# Patient Record
Sex: Male | Born: 1970 | Race: White | Hispanic: No | Marital: Married | Smoking: Never smoker
Health system: Southern US, Community
[De-identification: ages and names within clinical notes are randomized; demographics above are authoritative.]

## PROBLEM LIST (undated history)

## (undated) DIAGNOSIS — F419 Anxiety disorder, unspecified: Secondary | ICD-10-CM

## (undated) DIAGNOSIS — N200 Calculus of kidney: Secondary | ICD-10-CM

## (undated) HISTORY — PX: VASECTOMY: SHX75

---

## 2001-05-12 ENCOUNTER — Emergency Department (HOSPITAL_COMMUNITY): Admission: EM | Admit: 2001-05-12 | Discharge: 2001-05-12 | Payer: Self-pay | Admitting: Emergency Medicine

## 2001-12-26 ENCOUNTER — Encounter: Payer: Self-pay | Admitting: Emergency Medicine

## 2001-12-26 ENCOUNTER — Emergency Department (HOSPITAL_COMMUNITY): Admission: EM | Admit: 2001-12-26 | Discharge: 2001-12-26 | Payer: Self-pay | Admitting: Emergency Medicine

## 2005-09-06 ENCOUNTER — Emergency Department (HOSPITAL_COMMUNITY): Admission: EM | Admit: 2005-09-06 | Discharge: 2005-09-06 | Payer: Self-pay | Admitting: Family Medicine

## 2006-02-07 ENCOUNTER — Emergency Department (HOSPITAL_COMMUNITY): Admission: EM | Admit: 2006-02-07 | Discharge: 2006-02-07 | Payer: Self-pay | Admitting: Family Medicine

## 2008-02-22 ENCOUNTER — Encounter: Admission: RE | Admit: 2008-02-22 | Discharge: 2008-02-22 | Payer: Self-pay | Admitting: Internal Medicine

## 2009-02-25 ENCOUNTER — Emergency Department (HOSPITAL_COMMUNITY): Admission: EM | Admit: 2009-02-25 | Discharge: 2009-02-25 | Payer: Self-pay | Admitting: Family Medicine

## 2009-05-02 ENCOUNTER — Encounter: Admission: RE | Admit: 2009-05-02 | Discharge: 2009-05-02 | Payer: Self-pay | Admitting: Occupational Medicine

## 2010-12-23 ENCOUNTER — Encounter: Payer: Self-pay | Admitting: Internal Medicine

## 2011-03-14 LAB — URINE CULTURE
Colony Count: NO GROWTH
Culture: NO GROWTH

## 2011-03-14 LAB — POCT URINALYSIS DIP (DEVICE)
Bilirubin Urine: NEGATIVE
Glucose, UA: 100 mg/dL — AB
Hgb urine dipstick: NEGATIVE
Ketones, ur: NEGATIVE mg/dL
Nitrite: POSITIVE — AB
Protein, ur: NEGATIVE mg/dL
Specific Gravity, Urine: 1.015 (ref 1.005–1.030)
Urobilinogen, UA: 1 mg/dL (ref 0.0–1.0)
pH: 6.5 (ref 5.0–8.0)

## 2012-06-21 ENCOUNTER — Emergency Department (HOSPITAL_BASED_OUTPATIENT_CLINIC_OR_DEPARTMENT_OTHER)
Admission: EM | Admit: 2012-06-21 | Discharge: 2012-06-21 | Disposition: A | Discharge status: Home / Self Care | Payer: 59 | Attending: Emergency Medicine | Admitting: Emergency Medicine

## 2012-06-21 ENCOUNTER — Emergency Department (HOSPITAL_BASED_OUTPATIENT_CLINIC_OR_DEPARTMENT_OTHER): Payer: 59

## 2012-06-21 ENCOUNTER — Encounter (HOSPITAL_BASED_OUTPATIENT_CLINIC_OR_DEPARTMENT_OTHER): Payer: Self-pay | Admitting: *Deleted

## 2012-06-21 DIAGNOSIS — F411 Generalized anxiety disorder: Secondary | ICD-10-CM | POA: Insufficient documentation

## 2012-06-21 DIAGNOSIS — Z87442 Personal history of urinary calculi: Secondary | ICD-10-CM | POA: Insufficient documentation

## 2012-06-21 DIAGNOSIS — R109 Unspecified abdominal pain: Secondary | ICD-10-CM | POA: Insufficient documentation

## 2012-06-21 DIAGNOSIS — Z79899 Other long term (current) drug therapy: Secondary | ICD-10-CM | POA: Insufficient documentation

## 2012-06-21 DIAGNOSIS — N200 Calculus of kidney: Secondary | ICD-10-CM | POA: Insufficient documentation

## 2012-06-21 HISTORY — DX: Anxiety disorder, unspecified: F41.9

## 2012-06-21 HISTORY — DX: Calculus of kidney: N20.0

## 2012-06-21 LAB — CBC WITH DIFFERENTIAL/PLATELET
Basophils Absolute: 0.1 10*3/uL (ref 0.0–0.1)
Basophils Relative: 1 % (ref 0–1)
Eosinophils Absolute: 0.2 10*3/uL (ref 0.0–0.7)
Eosinophils Relative: 2 % (ref 0–5)
Lymphocytes Relative: 46 % (ref 12–46)
MCH: 31.3 pg (ref 26.0–34.0)
MCHC: 35 g/dL (ref 30.0–36.0)
MCV: 89.4 fL (ref 78.0–100.0)
Platelets: 257 10*3/uL (ref 150–400)
RDW: 12.4 % (ref 11.5–15.5)
WBC: 8.5 10*3/uL (ref 4.0–10.5)

## 2012-06-21 LAB — URINALYSIS, ROUTINE W REFLEX MICROSCOPIC
Hgb urine dipstick: NEGATIVE
Protein, ur: NEGATIVE mg/dL
Urobilinogen, UA: 1 mg/dL (ref 0.0–1.0)

## 2012-06-21 LAB — BASIC METABOLIC PANEL
Calcium: 9.5 mg/dL (ref 8.4–10.5)
GFR calc Af Amer: >90 mL/min (ref >90)
GFR calc non Af Amer: >90 mL/min (ref >90)
Sodium: 139 mEq/L (ref 135–145)

## 2012-06-21 MED ORDER — TAMSULOSIN HCL 0.4 MG PO CAPS: 0.4000 mg | ORAL_CAPSULE | Freq: Every day | ORAL | Status: DC

## 2012-06-21 MED ORDER — HYDROMORPHONE HCL PF 1 MG/ML IJ SOLN
1.0000 mg | Freq: Once | INTRAMUSCULAR | Status: AC
  Administered 2012-06-21: 1 mg via INTRAVENOUS
  Filled 2012-06-21: qty 1

## 2012-06-21 MED ORDER — OXYCODONE-ACETAMINOPHEN 5-325 MG PO TABS: 1.0000 | ORAL_TABLET | Freq: Four times a day (QID) | ORAL | Status: AC | PRN

## 2012-06-21 MED ORDER — KETOROLAC TROMETHAMINE 30 MG/ML IJ SOLN
30.0000 mg | Freq: Once | INTRAMUSCULAR | Status: AC
Start: 2012-06-21 — End: 2012-06-21
  Administered 2012-06-21: 30 mg via INTRAVENOUS
  Filled 2012-06-21: qty 1

## 2012-06-21 MED ORDER — ONDANSETRON HCL 4 MG/2ML IJ SOLN
4.0000 mg | Freq: Once | INTRAMUSCULAR | Status: AC
  Administered 2012-06-21: 4 mg via INTRAVENOUS
  Filled 2012-06-21: qty 2

## 2012-06-21 NOTE — ED Notes (Addendum)
C/o left flank pain that started at 0500. Hx of kidney stones in past. Pain 10/10 on arrival and pt. Unable to sit still. C/o nausea denies vomiting. resp even and unlabored.

## 2012-06-21 NOTE — ED Provider Notes (Addendum)
History     CSN: [id number]  Arrival date & time 06/21/12  4098   First MD Initiated Contact with Patient 06/21/12 [phone]      Chief Complaint  Patient presents with  . Flank Pain    (Consider location/radiation/quality/duration/timing/severity/associated sxs/prior treatment) HPI Comments: Pt states for the last few days he has had some intermittent lower back pain but this am became severe.  10/10 pain that is sharp in nature.  Patient is a 41 y.o. male presenting with flank pain. The history is provided by the patient.  Flank Pain This is a new problem. The current episode started 3 to 5 hours ago. The problem occurs constantly. The problem has been gradually worsening. Associated symptoms include abdominal pain. Associated symptoms comments: Radiating from left flank to the left groin. Exacerbated by: urinating. Nothing relieves the symptoms. He has tried nothing for the symptoms. The treatment provided no relief.    Past Medical History  Diagnosis Date  . Kidney stone   . Anxiety     Past Surgical History  Procedure Date  . Vasectomy     History reviewed. No pertinent family history.  History  Substance Use Topics  . Smoking status: Never Smoker   . Smokeless tobacco: Not on file  . Alcohol Use: No      Review of Systems  Constitutional: Negative for fever.  Gastrointestinal: Positive for nausea and abdominal pain. Negative for vomiting.  Genitourinary: Positive for dysuria and flank pain.  All other systems reviewed and are negative.    Allergies  Review of patient's allergies indicates no known allergies.  Home Medications   Current Outpatient Rx  Name Route Sig Dispense Refill  . ALPRAZOLAM 0.25 MG PO TABS Oral Take 1 mg by mouth as needed.    Marland Kitchen ESCITALOPRAM OXALATE 20 MG PO TABS Oral Take 20 mg by mouth daily.    Marland Kitchen RABEPRAZOLE SODIUM 20 MG PO TBEC Oral Take 20 mg by mouth daily.    Marland Kitchen RANITIDINE HCL 150 MG PO TABS Oral Take 150 mg by mouth 2 (two)  times daily.      BP 132/87  Pulse 88  Temp 97.8 F (36.6 C) (Oral)  Resp 21  SpO2 100%  Physical Exam  Nursing note and vitals reviewed. Constitutional: He is oriented to person, place, and time. He appears well-developed and well-nourished. He appears distressed.  HENT:  Head: Normocephalic and atraumatic.  Mouth/Throat: Oropharynx is clear and moist.  Eyes: Conjunctivae and EOM are normal. Pupils are equal, round, and reactive to light.  Neck: Normal range of motion. Neck supple.  Cardiovascular: Normal rate, regular rhythm and intact distal pulses.   No murmur heard. Pulmonary/Chest: Effort normal and breath sounds normal. No respiratory distress. He has no wheezes. He has no rales.  Abdominal: Soft. Normal appearance. He exhibits no distension. There is tenderness in the right lower quadrant. There is CVA tenderness. There is no rebound and no guarding.  Musculoskeletal: Normal range of motion. He exhibits no edema and no tenderness.  Neurological: He is alert and oriented to person, place, and time.  Skin: Skin is warm. No rash noted. He is diaphoretic. No erythema.  Psychiatric: He has a normal mood and affect. His behavior is normal.    ED Course  Procedures (including critical care time)  Labs Reviewed  CBC WITH DIFFERENTIAL - Abnormal; Notable for the following:    Neutrophils Relative 40 (*)     All other components within normal limits  BASIC  METABOLIC PANEL - Abnormal; Notable for the following:    Glucose, Bld 137 (*)     BUN 25 (*)     All other components within normal limits  URINALYSIS, ROUTINE W REFLEX MICROSCOPIC   Ct Abdomen Pelvis Wo Contrast  06/21/2012  *RADIOLOGY REPORT*  Clinical Data: Left flank pain, history of kidney stones  CT ABDOMEN AND PELVIS WITHOUT CONTRAST  Technique:  Multidetector CT imaging of the abdomen and pelvis was performed following the standard protocol without intravenous contrast.  Comparison: None.  Findings: Lung bases are  essentially clear.  Possible atherosclerotic calcification involving the right coronary artery (series 2/image 4).  Unenhanced liver, spleen, pancreas, and adrenal glands are within normal limits.  Gallbladder is unremarkable.  No intrahepatic or extrahepatic ductal dilatation.  Right kidney is within normal limits.  Punctate nonobstructing left upper pole calculus (series 5/image 62).  Mild left hydronephrosis with surrounding perinephric/periureteral stranding.  No evidence of bowel obstruction.  Normal appendix.  No evidence of abdominal aortic aneurysm.  No abdominopelvic ascites.  No suspicious abdominopelvic lymphadenopathy.  Prostate is unremarkable.  3 mm calculus in the distal left ureter at the UVJ (series 2/image 90).  Bladder is underdistended.  Visualized osseous structures are within normal limits.  IMPRESSION: 3 mm calculus in the distal left ureter at the UVJ.  Mild left hydronephrosis with surrounding perinephric/periureteral stranding.  Additional punctate nonobstructing left upper pole calculus.  Original Report Authenticated By: Charline Bills, M.D.     1. Kidney stone       MDM   Pt with symptoms consistent with kidney stone.  Denies infectious sx, or GI symptoms.  Low concern for diverticulitis and no risk factors or history suggestive of AAA.  No hx suggestive of GU source (discharge) and otherwise pt is healthy. Prior hx of stone over 11 years ago which passed on it's own.  Will hydrate, treat pain and ensure no infection with UA, CBC, CMP and will get stone study to further eval.  8:19 AM Labs without signs of UTI and CT with 3mm stone at the UVJ.  8:52 AM Pt tolerating po's and pain is improving.  Will d/c home with pain control and flomax.     Gwyneth Sprout, MD 06/21/12 1610  Gwyneth Sprout, MD 06/21/12 [phone]

## 2012-06-21 NOTE — ED Notes (Signed)
No relief with 1mg  of dilaudid. Dr. Anitra Lauth aware and orders received. Medicated per order. VSS. resp even and unlabored.

## 2012-12-02 IMAGING — CT CT ABD-PELV W/O CM
2 of 4 series · 16 of 46 positions shown, 18 images · non-contrast
Comparison: None.

CLINICAL DATA: Left flank pain, history of kidney stones

CT ABDOMEN AND PELVIS WITHOUT CONTRAST
TECHNIQUE: Multidetector CT imaging of the abdomen and pelvis was
performed following the standard protocol without intravenous
contrast.

[Series 2: renal stone < 200 lbs 5.0 b31f · axial · 0.71mm/px · z∈[-539,-99]mm · 13 of 98 slices shown, 15 images]
[im 5/98  soft-tissue]
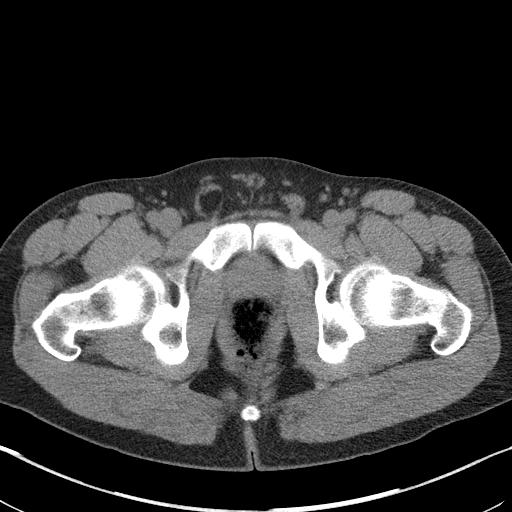
[im 5/98  bone]
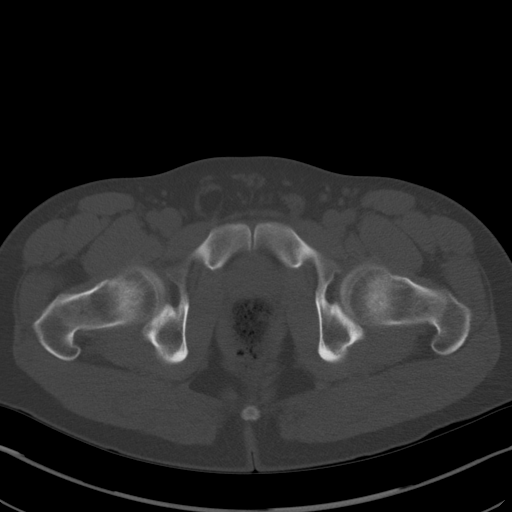
[im 14/98  soft-tissue]
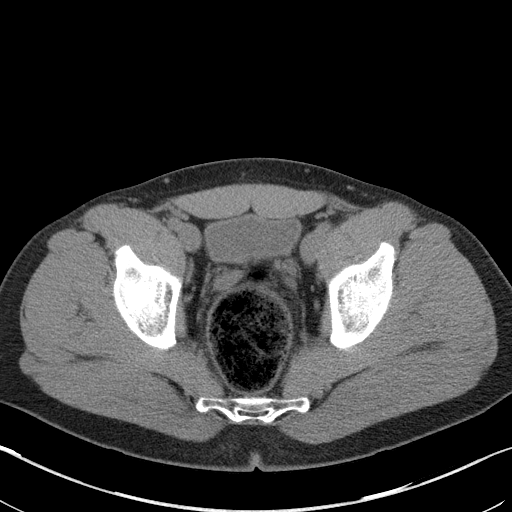
[im 19/98  soft-tissue]
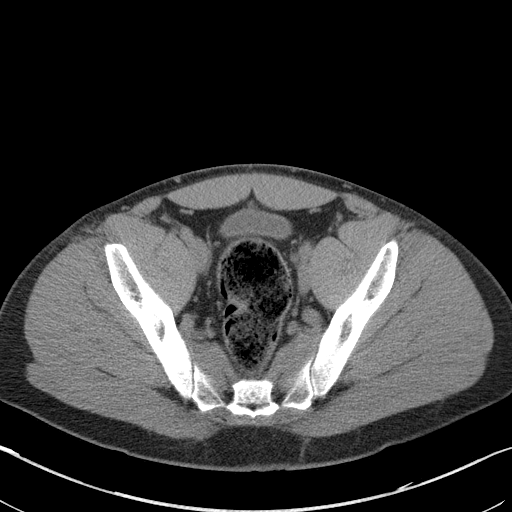
[im 28/98  soft-tissue]
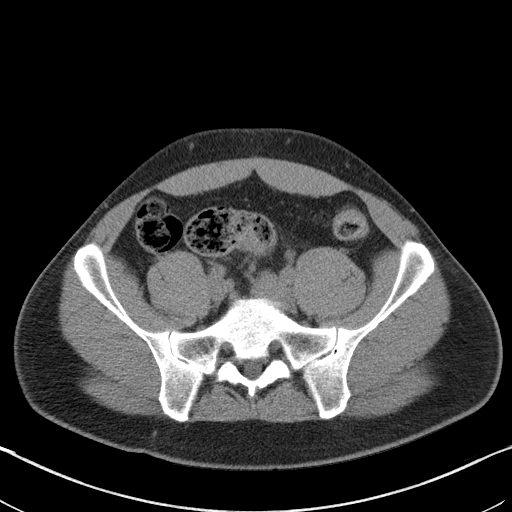
[im 33/98  soft-tissue]
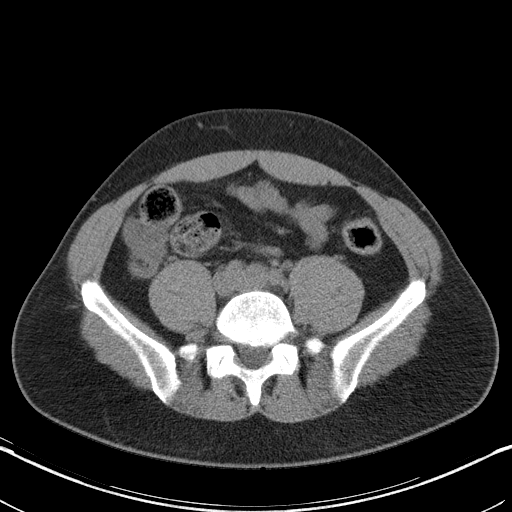
[im 42/98  soft-tissue]
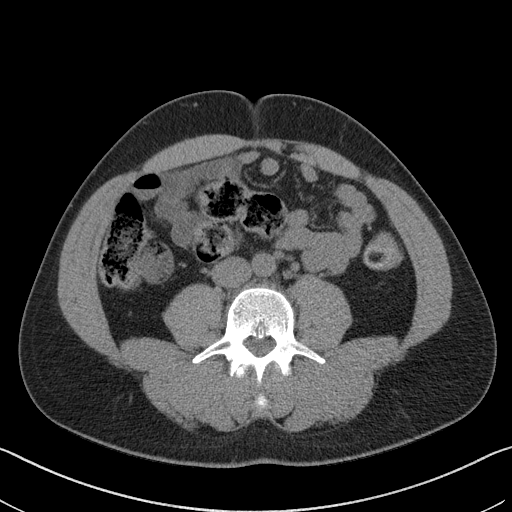
[im 51/98  soft-tissue]
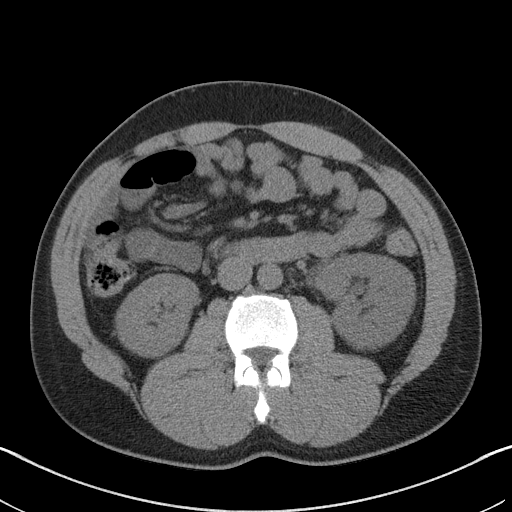
[im 56/98  soft-tissue]
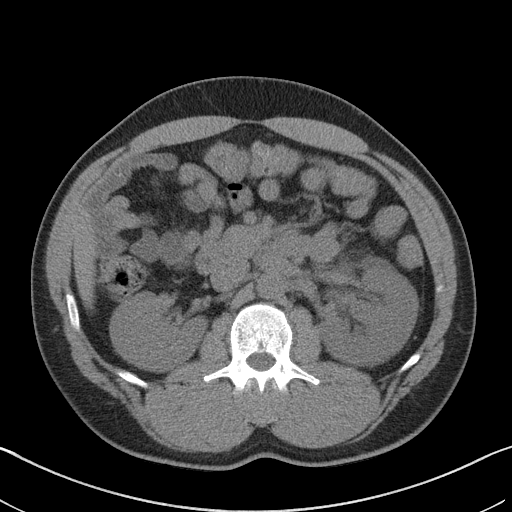
[im 65/98  soft-tissue]
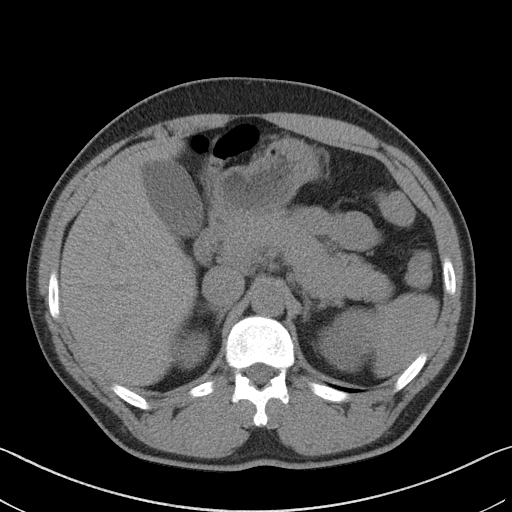
[im 65/98  bone]
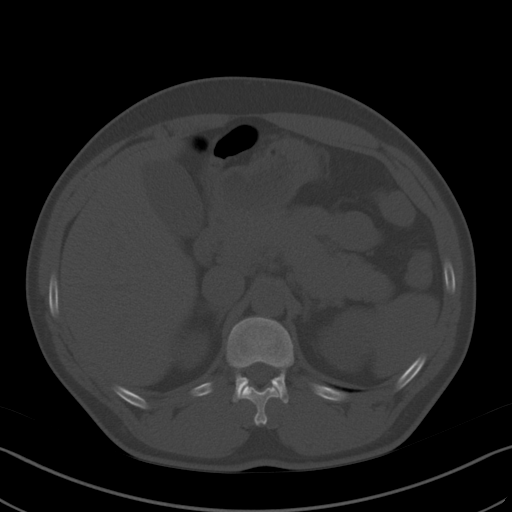
[im 70/98  soft-tissue]
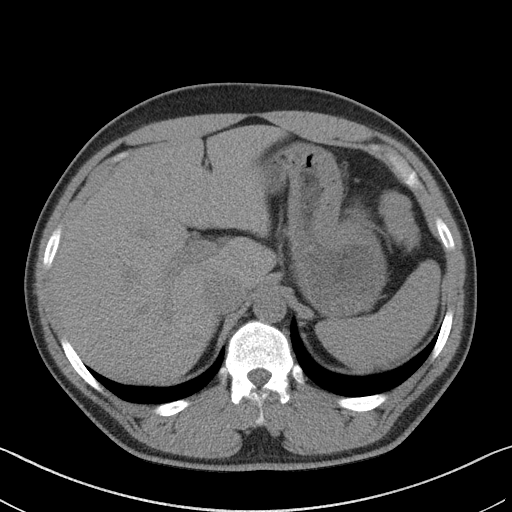
[im 79/98  soft-tissue]
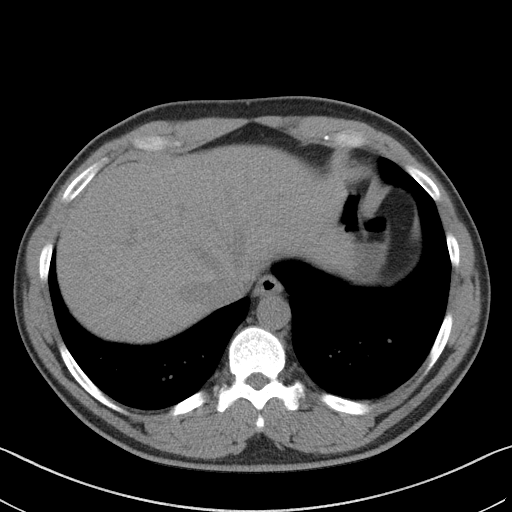
[im 84/98  soft-tissue]
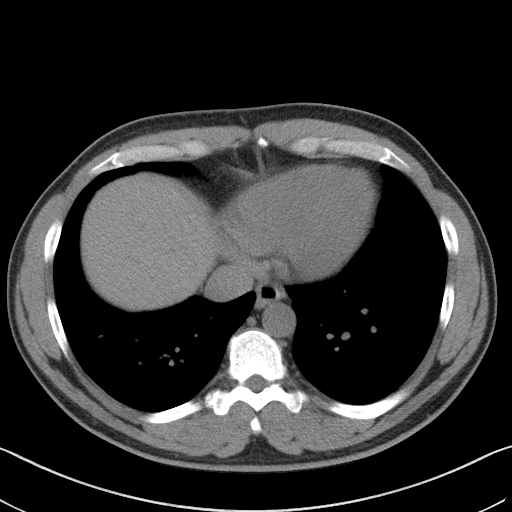
[im 93/98  soft-tissue]
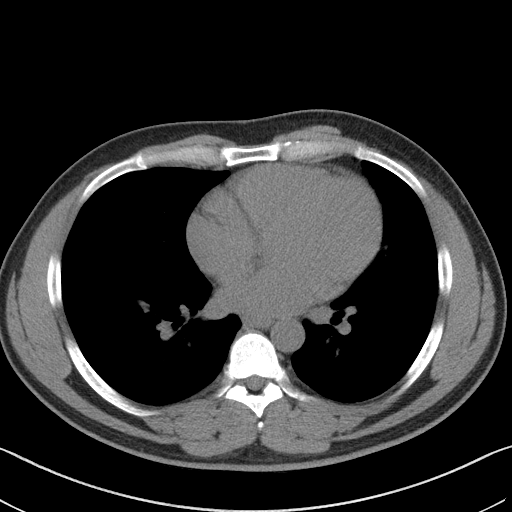

[Series 5: renal stone 3.0 coronal · coronal · 0.70mm/px · 3 of 92 slices shown]
[im 31/92  soft-tissue]
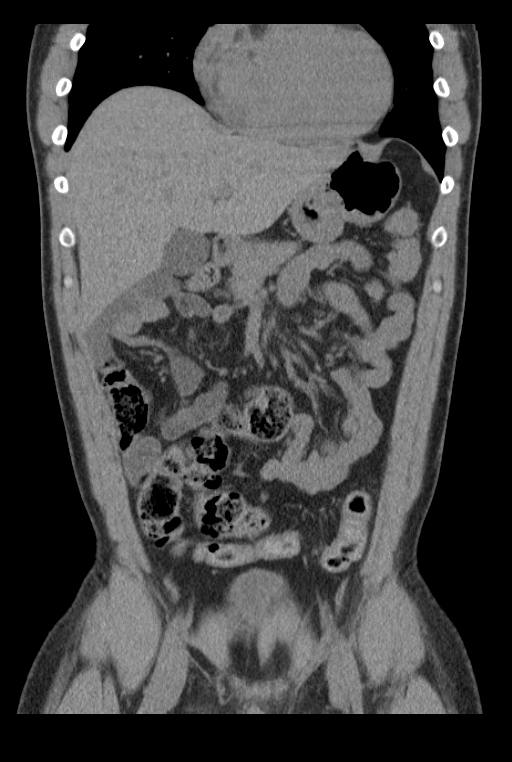
[im 41/92  soft-tissue]
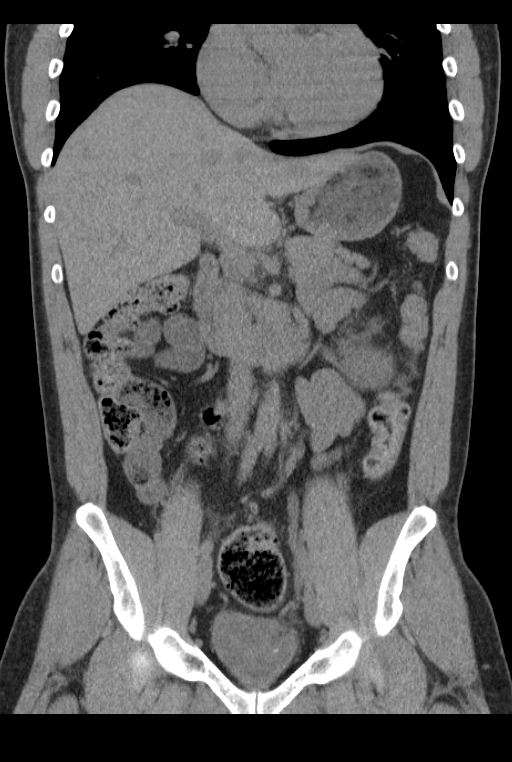
[im 51/92  soft-tissue]
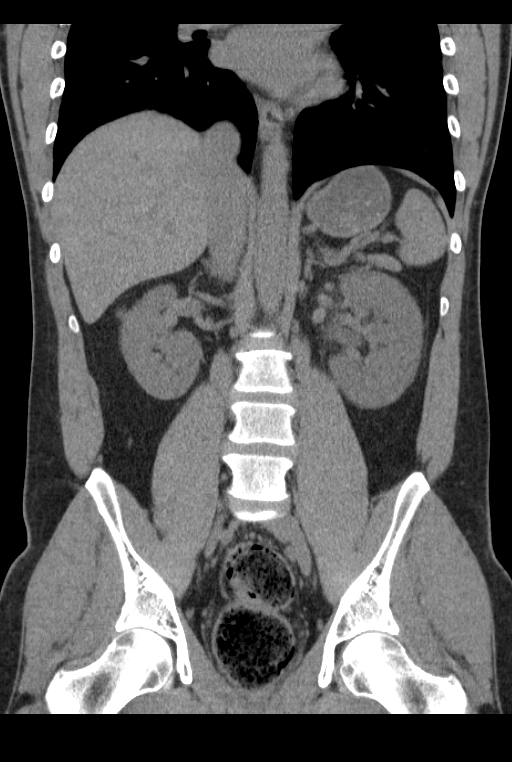

[16 of 46 positions shown; findings below may reference images not displayed]

FINDINGS: Lung bases are essentially clear.

Possible atherosclerotic calcification involving the right coronary
artery (series 2/image 4).

Unenhanced liver, spleen, pancreas, and adrenal glands are within
normal limits.

Gallbladder is unremarkable.  No intrahepatic or extrahepatic
ductal dilatation.

Right kidney is within normal limits.  Punctate nonobstructing left
upper pole calculus (series 5/image 62).  Mild left hydronephrosis
with surrounding perinephric/periureteral stranding.

No evidence of bowel obstruction.  Normal appendix.

No evidence of abdominal aortic aneurysm.

No abdominopelvic ascites.

No suspicious abdominopelvic lymphadenopathy.

Prostate is unremarkable.

3 mm calculus in the distal left ureter at the UVJ (series 2/image
90).  Bladder is underdistended.

Visualized osseous structures are within normal limits.
IMPRESSION: 3 mm calculus in the distal left ureter at the UVJ.  Mild left
hydronephrosis with surrounding perinephric/periureteral stranding.

Additional punctate nonobstructing left upper pole calculus.

## 2014-09-08 ENCOUNTER — Ambulatory Visit: Payer: 59 | Admitting: Endocrinology

## 2014-09-08 ENCOUNTER — Ambulatory Visit: Payer: 59 | Admitting: Internal Medicine

## 2014-09-08 ENCOUNTER — Ambulatory Visit (INDEPENDENT_AMBULATORY_CARE_PROVIDER_SITE_OTHER): Payer: 59 | Admitting: Endocrinology

## 2014-09-08 ENCOUNTER — Encounter: Payer: Self-pay | Admitting: Endocrinology

## 2014-09-08 VITALS — BP 132/71 | HR 68 | Temp 98.2°F | Resp 16 | Ht 71.0 in | Wt 216.4 lb

## 2014-09-08 DIAGNOSIS — R7989 Other specified abnormal findings of blood chemistry: Secondary | ICD-10-CM

## 2014-09-08 DIAGNOSIS — G4710 Hypersomnia, unspecified: Secondary | ICD-10-CM

## 2014-09-08 DIAGNOSIS — R5383 Other fatigue: Secondary | ICD-10-CM

## 2014-09-08 DIAGNOSIS — R400 Somnolence: Secondary | ICD-10-CM

## 2014-09-08 DIAGNOSIS — E291 Testicular hypofunction: Secondary | ICD-10-CM

## 2014-09-08 NOTE — Patient Instructions (Signed)
Leave off energy drinks and Androgel  Discuss sleep study with Dr Ricki MillerPang

## 2014-09-08 NOTE — Progress Notes (Signed)
Patient ID: Brendan Summers, male   DOB: Jan 04, 1971, 43 y.o.   MRN: [id number]          Chief complaint: Tiredness  History of Present Illness:  He says for the last year or so he has been feeling more sluggish. He usually wakes up feeling fairly normal but as the day goes on he feels more sluggish. He says that he does not stay active he will tend to get sleepy and could sleep more during the day He does not feel he has decreased muscle strength or stamina although he thinks he cannot build his muscle mass as much He tries to exercise regularly and for the last year has been taking an energy supplement from the health food store which may possibly contain a hormonal component, he is not clear about this  He does not complain of any hot flashes, breast enlargement, low impact fracture, decreased libido or decreased motivation He has been on treatment for depression for the last 2 years, previously seen by psychiatrist.  He has no history of androgenic steroid use, history of testicular injury or mumps in childhood.  Prior lab results show baselinetestosterone level of 338 (normal greater than 348) with normal free testosterone of 11.5 (normal greater than 6.8). He thinks this was done fasting Prolactin level: 10.8 LH level: Not done    Testoserone supplements that hehas used include AndroGel starting with 2 pumps a day in 6/15 and increased to 4 pumps a day and 9/15, recently taking 3 pumps daily on his upper arms and back of the shoulder, usually applying this early in the morning   Total testosterone in 9/15 was 293     Medication List       This list is accurate as of: 09/08/14  9:27 AM.  Always use your most recent med list.               ALPRAZolam 0.25 MG tablet  Commonly known as:  XANAX  Take 1 mg by mouth as needed.     ANDROGEL PUMP 20.25 MG/ACT (1.62%) Gel  Generic drug:  Testosterone  daily. Use 2 pumps on each shoulder daily     escitalopram 20 MG tablet   Commonly known as:  LEXAPRO  Take 20 mg by mouth daily.     RABEprazole 20 MG tablet  Commonly known as:  ACIPHEX  Take 20 mg by mouth daily.        Allergies: No Known Allergies  Past Medical History  Diagnosis Date  . Kidney stone   . Anxiety     Past Surgical History  Procedure Laterality Date  . Vasectomy      No family history on file.  Social History:  reports that he has never smoked. He does not have any smokeless tobacco history on file. He reports that he does not drink alcohol or use illicit drugs.  ROS  He has difficulty with gradual weight gain. He thinks this is mostly in his abdomen and he feels that he has increased body fat. Apparently he had body fat measurement done and it was about 29%, he thinks it was previously about 15%  He has had difficulties with sustaining erections for the last 2-3 years  He has had a history of frequent snoring especially in the last month or so. However his wife has not noticed any changes in his breathing patterns at night  No history of unusual headaches except some sinus headaches No history of blurred  vision including peripheral vision History of hypertension No history of abnormal fasting glucose or diabetes  He has had mild hyperlipidemia with triglycerides 247 and LDL 126  No history of anemia, last hemoglobin was 15.7  General Examination:   BP 132/71  Pulse 68  Temp(Src) 98.2 F (36.8 C)  Resp 16  Ht 5\' 11"  (1.803 m)  Wt 216 lb 6.4 oz (98.158 kg)  BMI 30.19 kg/m2  SpO2 97%  GENERAL APPEARANCE:mild abdominal obesity present but no cushingoid features like central fat deposition SKIN:normal, no rash or pigmentation.  HEENT:Oral mucosa normal. Normal oropharyngeal opening.  EYES:normal external appearance of eyes, Fundi benign.   NECK:no lymphadenopathy, no thyromegaly.  CHEST: Gynecomastia: Minimal breast tissue present LUNGS:clear to auscultation bilaterally, no  wheezes, rhonchi, rales.  HEART:normal S1 And S2, no S3, S4, murmur or click.  ABDOMEN:no hepatosplenomegaly, no masses palpated, soft and not tender.   MALE GENITOURINARY:Testicles are about 3 cm bilaterally, relatively small MUSCULOSKELETALNo enlargement or deformity of joints.  EXTREMITIES:no clubbing, no edema.  NEUROLOGIC EXAM: Biceps reflexes normal (2+) bilaterally.   Assessment/ Plan:  ? HYPOGONADISM:  He has nonspecific symptoms of fatigue, sluggishness and daytime somnolence associated with erectile dysfunction but no decreased muscle strength or decreased libido. Baseline free testosterone level is quite normal and this is more accurate than total testosterone especially with his having mild abdominal obesity Also not clear if he is taking a form of testosterone and his herbal supplement from the health food store which may be interfering with testosterone testing or levels  He has other issues that are potentially the cause of his fatigue such as sleep apnea because of his history of snoring, daytime somnolence and weight gain; also may have erectile dysfunction related to taking Lexapro  Discussed that possibly the Lexapro may be causing his weight gain over the last 2 years  Recommendations: Stop all herbal supplements and testosterone supplement to facilitate reevaluation as follows:  Check morning free testosterone level along with LH levels in 6 weeks. Further management will depend on his followup levels  He does need to have sleep apnea ruled out with a sleep study from his PCP and advised him to followup for this   [name],[name] 09/08/2014, 9:27 AM

## 2014-09-12 ENCOUNTER — Ambulatory Visit: Payer: 59 | Admitting: Internal Medicine

## 2023-04-07 ENCOUNTER — Other Ambulatory Visit (HOSPITAL_BASED_OUTPATIENT_CLINIC_OR_DEPARTMENT_OTHER): Payer: Self-pay | Admitting: Endocrinology

## 2023-04-07 DIAGNOSIS — E7800 Pure hypercholesterolemia, unspecified: Secondary | ICD-10-CM

## 2023-05-22 ENCOUNTER — Ambulatory Visit (HOSPITAL_BASED_OUTPATIENT_CLINIC_OR_DEPARTMENT_OTHER)
Admission: RE | Admit: 2023-05-22 | Discharge: 2023-05-22 | Disposition: A | Discharge status: Home / Self Care | Payer: Self-pay | Source: Ambulatory Visit | Attending: Endocrinology | Admitting: Endocrinology

## 2023-05-22 DIAGNOSIS — E7800 Pure hypercholesterolemia, unspecified: Secondary | ICD-10-CM | POA: Insufficient documentation

## 2023-05-25 NOTE — Progress Notes (Unsigned)
Cardiology Office Note:    Date:  05/28/2023   ID:  Brendan Summers, DOB Nov 09, 1971, MRN [id number]  PCP:  Rick Duff, PA-C  Cardiologist:  None  Electrophysiologist:  None   Referring MD: Rick Duff, PA-C   Chief Complaint  Patient presents with   Coronary Artery Disease    History of Present Illness:    Brendan Summers is a 52 y.o. male with a hx of hypogonadism, hypothyroidism, hyperlipidemia who is referred by Betsey Holiday, PA for evaluation of elevated calcium score.  Calcium score on 05/22/2023 was 171 (94th percentile), mild ascending aortic dilatation measuring 38 mm.  Denies any chest pain, dyspnea, lightheadedness, syncope, lower extremity edema, or palpitations.  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  Very active, does about 10,000 steps per day.  He denies any exertional symptoms.  No smoking history.  Family history includes paternal grandfather died of MI in 37s.   Past Medical History:  Diagnosis Date   Anxiety    Kidney stone     Past Surgical History:  Procedure Laterality Date   VASECTOMY      Current Medications: Current Meds  Medication Sig   Arginine (L-ARGININE-500) 500 MG CAPS Take by mouth as needed. 5 grams as needed   aspirin EC 81 MG tablet Take 81 mg by mouth daily. Swallow whole.   B-D 3CC LUER-LOK SYR 18GX1-1/2 18G X 1-1/2" 3 ML MISC USE TO DRAW UP TESTOSTERONE   Cholecalciferol (D3 EXTRA STRENGTH) 125 MCG (5000 UT) CAPS    citalopram (CELEXA) 10 MG tablet Take 10 mg by mouth daily.   escitalopram (LEXAPRO) 10 MG tablet Take 10 mg by mouth daily.   Homeopathic Products (LIVER SUPPORT SL)    levocetirizine (XYZAL ALLERGY 24HR) 5 MG tablet Take 5 mg by mouth every evening.   NP THYROID 90 MG tablet Take 90 mg by mouth daily.   Omega-3 1000 MG CAPS    rosuvastatin (CRESTOR) 10 MG tablet Take 1 tablet (10 mg total) by mouth daily.   Testosterone Cypionate 200 MG/ML KIT Inject 5 mLs into the skin. weekly     Allergies:   Patient  has no known allergies.   Social History   Socioeconomic History   Marital status: Married    Spouse name: Not on file   Number of children: Not on file   Years of education: Not on file   Highest education level: Not on file  Occupational History   Not on file  Tobacco Use   Smoking status: Never   Smokeless tobacco: Not on file  Substance and Sexual Activity   Alcohol use: No   Drug use: No   Sexual activity: Not on file  Other Topics Concern   Not on file  Social History Narrative   Not on file   Social Determinants of Health   Financial Resource Strain: Not on file  Food Insecurity: Not on file  Transportation Needs: Not on file  Physical Activity: Not on file  Stress: Not on file  Social Connections: Not on file     Family History: Family history includes paternal grandfather died of MI in 56s.  ROS:   Please see the history of present illness.     All other systems reviewed and are negative.  EKGs/Labs/Other Studies Reviewed:    The following studies were reviewed today:   EKG:   05/28/2023: Normal sinus rhythm, rate 66, no ST abnormalities  Recent Labs: No results found for  requested labs within last 365 days.  Recent Lipid Panel No results found for: "CHOL", "TRIG", "HDL", "CHOLHDL", "VLDL", "LDLCALC", "LDLDIRECT"  Physical Exam:    VS:  BP 124/82   Pulse 66   Ht 5\' 10"  (1.778 m)   Wt 218 lb 6.4 oz (99.1 kg)   SpO2 96%   BMI 31.34 kg/m     Wt Readings from Last 3 Encounters:  05/28/23 218 lb 6.4 oz (99.1 kg)  09/08/14 216 lb 6.4 oz (98.2 kg)     GEN:  Well nourished, well developed in no acute distress HEENT: Normal NECK: No JVD; No carotid bruits LYMPHATICS: No lymphadenopathy CARDIAC: RRR, no murmurs, rubs, gallops RESPIRATORY:  Clear to auscultation without rales, wheezing or rhonchi  ABDOMEN: Soft, non-tender, non-distended MUSCULOSKELETAL:  No edema; No deformity  SKIN: Warm and dry NEUROLOGIC:  Alert and oriented x  3 PSYCHIATRIC:  Normal affect   ASSESSMENT:    1. Coronary artery disease involving native coronary artery of native heart without angina pectoris   2. Hyperlipidemia, unspecified hyperlipidemia type   3. Aortic dilatation (HCC)    PLAN:    CAD:  Calcium score on 05/22/2023 was 171 (94th percentile), denies any anginal symptoms -Start rosuvastatin 10 mg daily  Aortic dilatation: Mild ascending aorta dilatation measuring 38 mm on CT 05/22/2023.  Follow-up with echocardiogram in 1 year  Hyperlipidemia: LDL 144 on 02/27/23.  Start rosuvastatin 10 mg daily as above.  Check fasting lipid panel in 2 months  RTC in 6 months   Medication Adjustments/Labs and Tests Ordered: Current medicines are reviewed at length with the patient today.  Concerns regarding medicines are outlined above.  Orders Placed This Encounter  Procedures   Lipid panel   EKG 12-Lead   ECHOCARDIOGRAM COMPLETE   Meds ordered this encounter  Medications   rosuvastatin (CRESTOR) 10 MG tablet    Sig: Take 1 tablet (10 mg total) by mouth daily.    Dispense:  90 tablet    Refill:  3    Patient Instructions  Medication Instructions:  Rosuvastatin 10 mg every evening *If you need a refill on your cardiac medications before your next appointment, please call your pharmacy*   Lab Work: Lipid panel- Please return for Blood Work in 2 months. No appointment needed, lab here at the office is open Monday-Friday from 8AM to 4PM. If you have labs (blood work) drawn today and your tests are completely normal, you will receive your results only by: MyChart Message (if you have MyChart) OR A paper copy in the mail If you have any lab test that is abnormal or we need to change your treatment, we will call you to review the results.   Testing/Procedures: Your physician has requested that you have an echocardiogram in One Year. Echocardiography is a painless test that uses sound waves to create images of your heart. It provides  your doctor with information about the size and shape of your heart and how well your heart's chambers and valves are working. This procedure takes approximately one hour. There are no restrictions for this procedure. Please do NOT wear cologne, perfume, aftershave, or lotions (deodorant is allowed). Please arrive 15 minutes prior to your appointment time.    Follow-Up: At [facility], you and your health needs are our priority.  As part of our continuing mission to provide you with exceptional heart care, we have created designated Provider Care Teams.  These Care Teams include your primary Cardiologist (physician) and Advanced  Practice Providers (APPs -  Physician Assistants and Nurse Practitioners) who all work together to provide you with the care you need, when you need it.  We recommend signing up for the patient portal called "MyChart".  Sign up information is provided on this After Visit Summary.  MyChart is used to connect with patients for Virtual Visits (Telemedicine).  Patients are able to view lab/test results, encounter notes, upcoming appointments, etc.  Non-urgent messages can be sent to your provider as well.   To learn more about what you can do with MyChart, go to [email or web address].    Your next appointment:   6 month(s)  Provider:   Dr Bjorn Pippin    Signed, Little Ishikawa, MD  05/28/2023 5:43 PM    [facility]

## 2023-05-28 ENCOUNTER — Encounter: Payer: Self-pay | Admitting: Cardiology

## 2023-05-28 ENCOUNTER — Ambulatory Visit: Payer: 59 | Attending: Cardiology | Admitting: Cardiology

## 2023-05-28 VITALS — BP 124/82 | HR 66 | Ht 70.0 in | Wt 218.4 lb

## 2023-05-28 DIAGNOSIS — E785 Hyperlipidemia, unspecified: Secondary | ICD-10-CM | POA: Diagnosis not present

## 2023-05-28 DIAGNOSIS — I2510 Atherosclerotic heart disease of native coronary artery without angina pectoris: Secondary | ICD-10-CM

## 2023-05-28 DIAGNOSIS — Z136 Encounter for screening for cardiovascular disorders: Secondary | ICD-10-CM

## 2023-05-28 DIAGNOSIS — I77819 Aortic ectasia, unspecified site: Secondary | ICD-10-CM

## 2023-05-28 MED ORDER — ROSUVASTATIN CALCIUM 10 MG PO TABS: 10.0000 mg | ORAL_TABLET | Freq: Every day | ORAL | 3 refills | Status: DC

## 2023-05-28 NOTE — Patient Instructions (Signed)
Medication Instructions:  Rosuvastatin 10 mg every evening *If you need a refill on your cardiac medications before your next appointment, please call your pharmacy*   Lab Work: Lipid panel- Please return for Blood Work in 2 months. No appointment needed, lab here at the office is open Monday-Friday from 8AM to 4PM. If you have labs (blood work) drawn today and your tests are completely normal, you will receive your results only by: MyChart Message (if you have MyChart) OR A paper copy in the mail If you have any lab test that is abnormal or we need to change your treatment, we will call you to review the results.   Testing/Procedures: Your physician has requested that you have an echocardiogram in One Year. Echocardiography is a painless test that uses sound waves to create images of your heart. It provides your doctor with information about the size and shape of your heart and how well your heart's chambers and valves are working. This procedure takes approximately one hour. There are no restrictions for this procedure. Please do NOT wear cologne, perfume, aftershave, or lotions (deodorant is allowed). Please arrive 15 minutes prior to your appointment time.    Follow-Up: At [facility], you and your health needs are our priority.  As part of our continuing mission to provide you with exceptional heart care, we have created designated Provider Care Teams.  These Care Teams include your primary Cardiologist (physician) and Advanced Practice Providers (APPs -  Physician Assistants and Nurse Practitioners) who all work together to provide you with the care you need, when you need it.  We recommend signing up for the patient portal called "MyChart".  Sign up information is provided on this After Visit Summary.  MyChart is used to connect with patients for Virtual Visits (Telemedicine).  Patients are able to view lab/test results, encounter notes, upcoming appointments, etc.  Non-urgent  messages can be sent to your provider as well.   To learn more about what you can do with MyChart, go to [email or web address].    Your next appointment:   6 month(s)  Provider:   Dr Bjorn Pippin

## 2023-07-22 ENCOUNTER — Encounter: Payer: Self-pay | Admitting: Cardiology

## 2023-07-22 ENCOUNTER — Telehealth: Payer: Self-pay | Admitting: Cardiology

## 2023-07-22 DIAGNOSIS — E785 Hyperlipidemia, unspecified: Secondary | ICD-10-CM

## 2023-07-22 NOTE — Telephone Encounter (Signed)
Pt c/o medication issue:  1. Name of Medication:   rosuvastatin (CRESTOR) 10 MG tablet   2. How are you currently taking this medication (dosage and times per day)?  As prescribed  3. Are you having a reaction (difficulty breathing--STAT)?   Sore muscles in lower back/hips and leg aches/stiffness  4. What is your medication issue?   Patient stated he had lab tests done at his job and this medication brought his cholesterol down but wants to know due to the side effects if he can have a medication change. Patient stated his cholesterol results were:  Total 164  HDL 66  Trigly  80   LDL 82 collected on 8/5.  Patient stated he is not due to do lab work until around Sep 4.

## 2023-07-22 NOTE — Telephone Encounter (Signed)
Patient states side effects since 2 weeks into taking medication.  He states it is a "constant ache, stiffness, and soreness"  States he has been to chiropractor was he does with pain and this has not helped.  Labs were done with his work physical on August 5 th.  He will send through My Chart. His next labs are due for September 5.  He ask to change the medication due to side effects.

## 2023-07-23 ENCOUNTER — Other Ambulatory Visit: Payer: Self-pay

## 2023-07-23 DIAGNOSIS — E785 Hyperlipidemia, unspecified: Secondary | ICD-10-CM

## 2023-07-23 MED ORDER — ATORVASTATIN CALCIUM 20 MG PO TABS: 20.0000 mg | ORAL_TABLET | Freq: Every day | ORAL | 6 refills | Status: DC

## 2023-07-23 NOTE — Telephone Encounter (Signed)
Recommend switch rosuvastatin to atorvastatin 20 mg daily.  Check fasting lipid panel in 2 months

## 2023-07-23 NOTE — Telephone Encounter (Signed)
See telephone encounter.  Labs look good and cholesterol has improved but with having myalgias on rosuvastatin would switch to atorvastatin 20 mg daily

## 2023-07-23 NOTE — Telephone Encounter (Signed)
Spoke to patient Dr.Schumann advised to stop Rosuvastatin.When pain has gone start Atorvastatin 20 mg daily.Check fasting lipid panel in 2 months after taking Atorvastatin.Lab order mailed.

## 2023-09-15 ENCOUNTER — Other Ambulatory Visit: Payer: Self-pay

## 2023-09-15 MED ORDER — ATORVASTATIN CALCIUM 20 MG PO TABS: 20.0000 mg | ORAL_TABLET | Freq: Every day | ORAL | 2 refills | Status: DC

## 2023-09-17 ENCOUNTER — Other Ambulatory Visit (HOSPITAL_BASED_OUTPATIENT_CLINIC_OR_DEPARTMENT_OTHER): Payer: Self-pay | Admitting: *Deleted

## 2023-09-17 MED ORDER — ATORVASTATIN CALCIUM 20 MG PO TABS: 20.0000 mg | ORAL_TABLET | Freq: Every day | ORAL | 2 refills | Status: DC

## 2023-11-10 ENCOUNTER — Encounter: Payer: Self-pay | Admitting: Cardiology

## 2023-11-12 LAB — LIPID PANEL
Chol/HDL Ratio: 2.4 {ratio} (ref 0.0–5.0)
Cholesterol, Total: 163 mg/dL (ref 100–199)
HDL: 67 mg/dL (ref >39)
LDL Chol Calc (NIH): 83 mg/dL (ref 0–99)
Triglycerides: 64 mg/dL (ref 0–149)
VLDL Cholesterol Cal: 13 mg/dL (ref 5–40)

## 2023-11-13 ENCOUNTER — Telehealth: Payer: Self-pay | Admitting: *Deleted

## 2023-11-13 DIAGNOSIS — E785 Hyperlipidemia, unspecified: Secondary | ICD-10-CM

## 2023-11-13 DIAGNOSIS — I2510 Atherosclerotic heart disease of native coronary artery without angina pectoris: Secondary | ICD-10-CM

## 2023-11-13 MED ORDER — ATORVASTATIN CALCIUM 40 MG PO TABS: 40.0000 mg | ORAL_TABLET | Freq: Every day | ORAL | 3 refills | Status: DC

## 2023-11-13 NOTE — Telephone Encounter (Signed)
Called patient and result given and Atorvastatin 40mg  sent to [facility] home delivery  pharmacy request by patients. Patient made aware to have fasting labs done in 2 months per Dr. Bjorn Pippin recommendations.

## 2023-11-16 NOTE — Progress Notes (Unsigned)
Cardiology Office Note:    Date:  11/16/2023   ID:  YEHYA ZILE, DOB 1971-10-16, MRN [id number]  PCP:  Rick Duff, PA-C  Cardiologist:  None  Electrophysiologist:  None   Referring MD: Rick Duff, PA-C   No chief complaint on file.   History of Present Illness:    Brendan Summers is a 52 y.o. male with a hx of hypogonadism, hypothyroidism, hyperlipidemia who presents for follow-up.  He was referred by Betsey Holiday, PA for evaluation of elevated calcium score, initially seen 05/28/2023.  Calcium score on 05/22/2023 was 171 (94th percentile), mild ascending aortic dilatation measuring 38 mm.  Denies any chest pain, dyspnea, lightheadedness, syncope, lower extremity edema, or palpitations.  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  Very active, does about 10,000 steps per day.  He denies any exertional symptoms.  No smoking history.  Family history includes paternal grandfather died of MI in 35s.  At initial clinic visit was started on rosuvastatin 10 mg daily but developed myalgias.  Switch to atorvastatin 20 mg daily   Past Medical History:  Diagnosis Date   Anxiety    Kidney stone     Past Surgical History:  Procedure Laterality Date   VASECTOMY      Current Medications: No outpatient medications have been marked as taking for the 11/18/23 encounter (Appointment) with Little Ishikawa, MD.     Allergies:   Patient has no known allergies.   Social History   Socioeconomic History   Marital status: Married    Spouse name: Not on file   Number of children: Not on file   Years of education: Not on file   Highest education level: Not on file  Occupational History   Not on file  Tobacco Use   Smoking status: Never   Smokeless tobacco: Not on file  Substance and Sexual Activity   Alcohol use: No   Drug use: No   Sexual activity: Not on file  Other Topics Concern   Not on file  Social History Narrative   Not on file   Social Drivers of Health    Financial Resource Strain: Not on file  Food Insecurity: Not on file  Transportation Needs: Not on file  Physical Activity: Not on file  Stress: Not on file  Social Connections: Not on file     Family History: Family history includes paternal grandfather died of MI in 60s.  ROS:   Please see the history of present illness.     All other systems reviewed and are negative.  EKGs/Labs/Other Studies Reviewed:    The following studies were reviewed today:   EKG:   05/28/2023: Normal sinus rhythm, rate 66, no ST abnormalities  Recent Labs: No results found for requested labs within last 365 days.  Recent Lipid Panel    Component Value Date/Time   CHOL 163 11/11/2023 0809   TRIG 64 11/11/2023 0809   HDL 67 11/11/2023 0809   CHOLHDL 2.4 11/11/2023 0809   LDLCALC 83 11/11/2023 0809    Physical Exam:    VS:  There were no vitals taken for this visit.    Wt Readings from Last 3 Encounters:  05/28/23 218 lb 6.4 oz (99.1 kg)  09/08/14 216 lb 6.4 oz (98.2 kg)     GEN:  Well nourished, well developed in no acute distress HEENT: Normal NECK: No JVD; No carotid bruits LYMPHATICS: No lymphadenopathy CARDIAC: RRR, no murmurs, rubs, gallops RESPIRATORY:  Clear to auscultation  without rales, wheezing or rhonchi  ABDOMEN: Soft, non-tender, non-distended MUSCULOSKELETAL:  No edema; No deformity  SKIN: Warm and dry NEUROLOGIC:  Alert and oriented x 3 PSYCHIATRIC:  Normal affect   ASSESSMENT:    No diagnosis found.  PLAN:    CAD:  Calcium score on 05/22/2023 was 171 (94th percentile), denies any anginal symptoms -At initial clinic visit was started on rosuvastatin 10 mg daily but developed myalgias.  Switched to atorvastatin 20 mg daily.  LDL 83 on 11/11/2023, atorvastatin increased to 40 mg daily  Aortic dilatation: Mild ascending aorta dilatation measuring 38 mm on CT 05/22/2023.  Follow-up with echocardiogram in 1 year  Hyperlipidemia: LDL 144 on 02/27/23. At initial  clinic visit was started on rosuvastatin 10 mg daily but developed myalgias.  Switched to atorvastatin 20 mg daily.  LDL 83 on 11/11/2023, atorvastatin increased to 40 mg daily  RTC in 6 months***   Medication Adjustments/Labs and Tests Ordered: Current medicines are reviewed at length with the patient today.  Concerns regarding medicines are outlined above.  No orders of the defined types were placed in this encounter.  No orders of the defined types were placed in this encounter.   There are no Patient Instructions on file for this visit.   Signed, Little Ishikawa, MD  11/16/2023 2:29 PM    [facility]

## 2023-11-18 ENCOUNTER — Ambulatory Visit: Payer: 59 | Admitting: Cardiology

## 2023-11-19 ENCOUNTER — Encounter: Payer: Self-pay | Admitting: Cardiology

## 2024-01-17 LAB — LIPID PANEL
Chol/HDL Ratio: 2.6 {ratio} (ref 0.0–5.0)
Cholesterol, Total: 144 mg/dL (ref 100–199)
HDL: 56 mg/dL (ref >39)
LDL Chol Calc (NIH): 73 mg/dL (ref 0–99)
Triglycerides: 79 mg/dL (ref 0–149)
VLDL Cholesterol Cal: 15 mg/dL (ref 5–40)

## 2024-01-18 NOTE — Progress Notes (Unsigned)
 Cardiology Office Note:    Date:  01/22/2024   ID:  Brendan Summers, DOB 09-01-1971, MRN [id number]  PCP:  Rick Duff, PA-C  Cardiologist:  None  Electrophysiologist:  None   Referring MD: Rick Duff, PA-C   Chief Complaint  Patient presents with   Coronary Artery Disease    History of Present Illness:    Brendan Summers is a 53 y.o. male with a hx of hypogonadism, hypothyroidism, hyperlipidemia who presents for follow-up.  He was referred by Betsey Holiday, PA for evaluation of elevated calcium score, initially seen 05/28/2023.  Calcium score on 05/22/2023 was 171 (94th percentile), mild ascending aortic dilatation measuring 38 mm.  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  No smoking history.  Family history includes paternal grandfather died of MI in 91s.  Since last clinic visit, he reports he is doing okay.  He continues to have myalgias on atorvastatin.  Denies any chest pain, dyspnea, lightheadedness, syncope, lower extremity edema, or palpitations.  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  Denies any exertional symptoms.   Past Medical History:  Diagnosis Date   Anxiety    Kidney stone     Past Surgical History:  Procedure Laterality Date   VASECTOMY      Current Medications: Current Meds  Medication Sig   Arginine (L-ARGININE-500) 500 MG CAPS Take by mouth as needed. 5 grams as needed   aspirin EC 81 MG tablet Take 81 mg by mouth daily. Swallow whole.   B-D 3CC LUER-LOK SYR 18GX1-1/2 18G X 1-1/2" 3 ML MISC USE TO DRAW UP TESTOSTERONE   Cholecalciferol (D3 EXTRA STRENGTH) 125 MCG (5000 UT) CAPS    escitalopram (LEXAPRO) 10 MG tablet Take 10 mg by mouth daily.   levocetirizine (XYZAL ALLERGY 24HR) 5 MG tablet Take 5 mg by mouth every evening.   NP THYROID 90 MG tablet Take 90 mg by mouth daily.   Omega-3 1000 MG CAPS    Testosterone Cypionate 200 MG/ML KIT Inject 5 mLs into the  skin. weekly   [DISCONTINUED] atorvastatin (LIPITOR) 40 MG tablet Take 1 tablet (40 mg total) by mouth daily.     Allergies:   Patient has no known allergies.   Social History   Socioeconomic History   Marital status: Married    Spouse name: Not on file   Number of children: Not on file   Years of education: Not on file   Highest education level: Not on file  Occupational History   Not on file  Tobacco Use   Smoking status: Never   Smokeless tobacco: Not on file  Substance and Sexual Activity   Alcohol use: No   Drug use: No   Sexual activity: Yes    Partners: Female    Comment: MARRIED  Other Topics Concern   Not on file  Social History Narrative   Not on file   Social Drivers of Health   Financial Resource Strain: Not on file  Food Insecurity: Not on file  Transportation Needs: Not on file  Physical Activity: Not on file  Stress: Not on file  Social Connections: Not on file     Family History: Family history includes paternal grandfather died of MI in 59s.  ROS:   Please see the history of present illness.     All other systems reviewed and are negative.  EKGs/Labs/Other Studies  Reviewed:    The following studies were reviewed today:   EKG:   05/28/2023: Normal sinus rhythm, rate 66, no ST abnormalities 01/22/2024: Normal sinus rhythm, rate 60, no ST abnormalities  Recent Labs: No results found for requested labs within last 365 days.  Recent Lipid Panel    Component Value Date/Time   CHOL 144 01/16/2024 0804   TRIG 79 01/16/2024 0804   HDL 56 01/16/2024 0804   CHOLHDL 2.6 01/16/2024 0804   LDLCALC 73 01/16/2024 0804    Physical Exam:    VS:  BP (!) 140/78   Pulse 60   Ht 5\' 10"  (1.778 m)   Wt 232 lb (105.2 kg)   SpO2 97%   BMI 33.29 kg/m     Wt Readings from Last 3 Encounters:  01/22/24 232 lb (105.2 kg)  05/28/23 218 lb 6.4 oz (99.1 kg)  09/08/14 216 lb 6.4 oz (98.2 kg)     GEN:  Well nourished, well developed in no acute  distress HEENT: Normal NECK: No JVD; No carotid bruits LYMPHATICS: No lymphadenopathy CARDIAC: RRR, no murmurs, rubs, gallops RESPIRATORY:  Clear to auscultation without rales, wheezing or rhonchi  ABDOMEN: Soft, non-tender, non-distended MUSCULOSKELETAL:  No edema; No deformity  SKIN: Warm and dry NEUROLOGIC:  Alert and oriented x 3 PSYCHIATRIC:  Normal affect   ASSESSMENT:    1. Coronary artery disease involving native coronary artery of native heart without angina pectoris   2. Aortic dilatation (HCC)   3. Hyperlipidemia, unspecified hyperlipidemia type   4. Elevated BP without diagnosis of hypertension     PLAN:    CAD:  Calcium score on 05/22/2023 was 171 (94th percentile), denies any anginal symptoms -At initial clinic visit was started on rosuvastatin 10 mg daily but developed myalgias.  Switched to atorvastatin 20 mg daily.  LDL 83 on 11/11/2023, atorvastatin increased to 40 mg daily.  LDL 73 on 01/16/2024.  He is now having myalgias on atorvastatin, will discontinue and refer to pharmacy lipid clinic  Aortic dilatation: Mild ascending aorta dilatation measuring 38 mm on CT 05/22/2023.  Follow-up with echocardiogram in 1 year  Hyperlipidemia: LDL 144 on 02/27/23. At initial clinic visit was started on rosuvastatin 10 mg daily but developed myalgias.  Switched to atorvastatin 20 mg daily.  LDL 83 on 11/11/2023, atorvastatin increased to 40 mg daily.  LDL 73 on 01/16/2024 -He is reporting myalgias on atorvastatin.  Recommend discontinuing and will refer to pharmacy lipid clinic to evaluate for PCSK9 inhibitor  Elevated BP: No diagnosis of hypertension, BP mildly elevated in clinic today.  Asked to check BP twice daily for next week and call with results  RTC in 1 year   Medication Adjustments/Labs and Tests Ordered: Current medicines are reviewed at length with the patient today.  Concerns regarding medicines are outlined above.  Orders Placed This Encounter  Procedures   AMB  Referral to [facility] Pharm-D   EKG 12-Lead   No orders of the defined types were placed in this encounter.   Patient Instructions  Medication Instructions:  Stop Atorvastatin 40mg  as discussed with your provider Continue current medication  *If you need a refill on your cardiac medications before your next appointment, please call your pharmacy*   Lab Work: none If you have labs (blood work) drawn today and your tests are completely normal, you will receive your results only by: MyChart Message (if you have MyChart) OR A paper copy in the mail If you have any lab test that is  abnormal or we need to change your treatment, we will call you to review the results.   Testing/Procedures: none   Follow-Up: At [facility], you and your health needs are our priority.  As part of our continuing mission to provide you with exceptional heart care, we have created designated Provider Care Teams.  These Care Teams include your primary Cardiologist (physician) and Advanced Practice Providers (APPs -  Physician Assistants and Nurse Practitioners) who all work together to provide you with the care you need, when you need it.  We recommend signing up for the patient portal called "MyChart".  Sign up information is provided on this After Visit Summary.  MyChart is used to connect with patients for Virtual Visits (Telemedicine).  Patients are able to view lab/test results, encounter notes, upcoming appointments, etc.  Non-urgent messages can be sent to your provider as well.   To learn more about what you can do with MyChart, go to [email or web address].    Your next appointment:   1 year(s)  Provider:   Dr. Bjorn Pippin Other Instructions Referral to Pharm D cholesterol Please purchase upper arm Omron  blood pressure cuff  Please check blood pressure twice a day for week and send those reading via my chart       Signed, Little Ishikawa, MD  01/22/2024 1:58 PM    [facility]

## 2024-01-20 ENCOUNTER — Encounter: Payer: Self-pay | Admitting: *Deleted

## 2024-01-22 ENCOUNTER — Ambulatory Visit: Payer: 59 | Attending: Cardiology | Admitting: Cardiology

## 2024-01-22 ENCOUNTER — Encounter: Payer: Self-pay | Admitting: Cardiology

## 2024-01-22 VITALS — BP 140/78 | HR 60 | Ht 70.0 in | Wt 232.0 lb

## 2024-01-22 DIAGNOSIS — I2510 Atherosclerotic heart disease of native coronary artery without angina pectoris: Secondary | ICD-10-CM | POA: Diagnosis not present

## 2024-01-22 DIAGNOSIS — R030 Elevated blood-pressure reading, without diagnosis of hypertension: Secondary | ICD-10-CM

## 2024-01-22 DIAGNOSIS — E785 Hyperlipidemia, unspecified: Secondary | ICD-10-CM | POA: Diagnosis not present

## 2024-01-22 DIAGNOSIS — I77819 Aortic ectasia, unspecified site: Secondary | ICD-10-CM

## 2024-01-22 NOTE — Patient Instructions (Addendum)
 Medication Instructions:  Stop Atorvastatin 40mg  as discussed with your provider Continue current medication  *If you need a refill on your cardiac medications before your next appointment, please call your pharmacy*   Lab Work: none If you have labs (blood work) drawn today and your tests are completely normal, you will receive your results only by: MyChart Message (if you have MyChart) OR A paper copy in the mail If you have any lab test that is abnormal or we need to change your treatment, we will call you to review the results.   Testing/Procedures: none   Follow-Up: At [facility], you and your health needs are our priority.  As part of our continuing mission to provide you with exceptional heart care, we have created designated Provider Care Teams.  These Care Teams include your primary Cardiologist (physician) and Advanced Practice Providers (APPs -  Physician Assistants and Nurse Practitioners) who all work together to provide you with the care you need, when you need it.  We recommend signing up for the patient portal called "MyChart".  Sign up information is provided on this After Visit Summary.  MyChart is used to connect with patients for Virtual Visits (Telemedicine).  Patients are able to view lab/test results, encounter notes, upcoming appointments, etc.  Non-urgent messages can be sent to your provider as well.   To learn more about what you can do with MyChart, go to [email or web address].    Your next appointment:   1 year(s)  Provider:   Dr. Bjorn Pippin Other Instructions Referral to Pharm D cholesterol Please purchase upper arm Omron  blood pressure cuff  Please check blood pressure twice a day for week and send those reading via my chart

## 2024-01-30 ENCOUNTER — Encounter: Payer: Self-pay | Admitting: Cardiology

## 2024-02-01 NOTE — Telephone Encounter (Signed)
 Recommend starting amlodipine 5 mg daily.  Check BP once daily for next 2 weeks and let us know results

## 2024-02-03 MED ORDER — AMLODIPINE BESYLATE 5 MG PO TABS: 5.0000 mg | ORAL_TABLET | Freq: Every day | ORAL | 6 refills | Status: DC

## 2024-02-03 NOTE — Telephone Encounter (Signed)
 Spoke to patient Dr.Schumann advised to start Amlodipine 5 mg daily.Check B/P daily for 2 weeks and send readings via mychart.

## 2024-02-22 NOTE — Telephone Encounter (Signed)
 BP remains elevated, recommend increase amlodipine to 10 mg daily.  Check BP daily for next 2 weeks and let us know results

## 2024-03-01 ENCOUNTER — Other Ambulatory Visit: Payer: Self-pay | Admitting: *Deleted

## 2024-03-01 MED ORDER — AMLODIPINE BESYLATE 10 MG PO TABS: 10.0000 mg | ORAL_TABLET | Freq: Every day | ORAL | 3 refills | Status: DC

## 2024-03-19 ENCOUNTER — Ambulatory Visit
Payer: 59 | Attending: Pharmacist Clinician (PhC)/ Clinical Pharmacy Specialist | Admitting: Pharmacist Clinician (PhC)/ Clinical Pharmacy Specialist

## 2024-03-19 NOTE — Progress Notes (Deleted)
 Office Visit    Patient Name: Brendan Summers Date of Encounter: 03/19/2024  Primary Care Provider:  Ventura Gins, PA-C Primary Cardiologist:  None  Chief Complaint    Hyperlipidemia   Significant Past Medical History   CAD 6/24 CAC = 171 (94th percentile)  hypothyroid 12/24 TSH WNLon NP thyroid  HTN Controlled on amlodipine            No Known Allergies  History of Present Illness    Brendan Summers is a 53 y.o. male patient of Dr Alda Amas, in the office today to discuss options for cholesterol management.  Insurance Carrier:   Pharmacy:   [facility] - commercial   LDL Cholesterol goal:  LDL < 70  Current Medications:  none  Previously tried:  rosuvastatin  10 mg, atorvastatin  40 mg - myalgias  Family Hx:     Social Hx: Tobacco: Alcohol:      Diet:      Exercise:   Adherence Assessment  Do you ever forget to take your medication? [] Yes [] No  Do you ever skip doses due to side effects? [] Yes [] No  Do you have trouble affording your medicines? [] Yes [] No  Are you ever unable to pick up your medication due to transportation difficulties? [] Yes [] No  Do you ever stop taking your medications because you don't believe they are helping? [] Yes [] No  Do you check your weight daily? [] Yes [] No   Adherence strategy: ***  Barriers to obtaining medications: ***     Accessory Clinical Findings   Lab Results  Component Value Date   CHOL 144 01/16/2024   HDL 56 01/16/2024   LDLCALC 73 01/16/2024   TRIG 79 01/16/2024   CHOLHDL 2.6 01/16/2024    No results found for: "LIPOA"  No results found for: "ALT", "AST", "GGT", "ALKPHOS", "BILITOT" Lab Results  Component Value Date   CREATININE 1.00 06/21/2012   BUN 25 (H) 06/21/2012   NA 139 06/21/2012   K 3.5 06/21/2012   CL 100 06/21/2012   CO2 25 06/21/2012   No results found for: "HGBA1C"  Home Medications    Current Outpatient Medications  Medication Sig Dispense Refill   ALPRAZolam (XANAX) 0.25  MG tablet Take 1 mg by mouth as needed.     amLODipine  (NORVASC ) 10 MG tablet Take 1 tablet (10 mg total) by mouth daily. 90 tablet 3   ANDROGEL PUMP 20.25 MG/ACT (1.62%) GEL daily. Use 2 pumps on each shoulder daily     Arginine (L-ARGININE-500) 500 MG CAPS Take by mouth as needed. 5 grams as needed     aspirin EC 81 MG tablet Take 81 mg by mouth daily. Swallow whole.     B-D 3CC LUER-LOK SYR 18GX1-1/2 18G X 1-1/2" 3 ML MISC USE TO DRAW UP TESTOSTERONE     Cholecalciferol (D3 EXTRA STRENGTH) 125 MCG (5000 UT) CAPS      citalopram (CELEXA) 10 MG tablet Take 10 mg by mouth daily. (Patient not taking: Reported on 01/22/2024)     escitalopram (LEXAPRO) 10 MG tablet Take 10 mg by mouth daily.     escitalopram (LEXAPRO) 20 MG tablet Take 20 mg by mouth daily.     Homeopathic Products (LIVER SUPPORT SL)      levocetirizine (XYZAL ALLERGY 24HR) 5 MG tablet Take 5 mg by mouth every evening.     NP THYROID 90 MG tablet Take 90 mg by mouth daily.     Omega-3 1000 MG CAPS      RABEprazole (  ACIPHEX) 20 MG tablet Take 20 mg by mouth daily.     Testosterone Cypionate 200 MG/ML KIT Inject 5 mLs into the skin. weekly     No current facility-administered medications for this visit.     Assessment & Plan    No problem-specific Assessment & Plan notes found for this encounter.   Jimmi Sidener, PharmD CPP [facility] [address]  [place], [place] [address] [phone]  03/19/2024, 6:43 AM

## 2024-04-14 ENCOUNTER — Encounter (HOSPITAL_BASED_OUTPATIENT_CLINIC_OR_DEPARTMENT_OTHER): Payer: Self-pay | Admitting: Cardiology

## 2024-05-18 ENCOUNTER — Ambulatory Visit (HOSPITAL_COMMUNITY)

## 2024-06-01 ENCOUNTER — Ambulatory Visit: Attending: Cardiology | Admitting: Pharmacist Clinician (PhC)/ Clinical Pharmacy Specialist

## 2024-06-01 ENCOUNTER — Telehealth: Payer: Self-pay | Admitting: Pharmacist Clinician (PhC)/ Clinical Pharmacy Specialist

## 2024-06-01 ENCOUNTER — Encounter: Payer: Self-pay | Admitting: Pharmacist Clinician (PhC)/ Clinical Pharmacy Specialist

## 2024-06-01 DIAGNOSIS — E785 Hyperlipidemia, unspecified: Secondary | ICD-10-CM | POA: Diagnosis not present

## 2024-06-01 NOTE — Telephone Encounter (Signed)
 Please do PA for Repatha, updated labs in my note

## 2024-06-01 NOTE — Assessment & Plan Note (Signed)
 Assessment: Patient with ASCVD not at LDL goal of < 70 Most recent LDL 163 on 04/13/24 Not able to tolerate statins secondary to myalgias - tried atorvastatin  and rosuvastatin  Reviewed options for lowering LDL cholesterol, including ezetimibe, PCSK-9 inhibitors, bempedoic acid and inclisiran.  Discussed mechanisms of action, dosing, side effects, potential decreases in LDL cholesterol and costs.  Also reviewed potential options for patient assistance.  Plan: Patient agreeable to starting Repatha 140 mg q14d Repeat labs after:  3 months Lipid Liver function

## 2024-06-01 NOTE — Patient Instructions (Signed)
 Your Results:             Your most recent labs Goal  Total Cholesterol 144 < 200  Triglycerides 79 < 150  HDL (happy/good cholesterol) 56 > 40  LDL (lousy/bad cholesterol 73 < 70   Medication changes:  We will start the process to get Repatha covered by your insurance.  Once the prior authorization is complete, I will call/send a MyChart message to let you know and confirm pharmacy information.   You will take one injection every 14 days  Lab orders:  We want to repeat labs after 2-3 months.  We will send you a lab order to remind you once we get closer to that time.    Thank you for choosing [facility]

## 2024-06-01 NOTE — Progress Notes (Signed)
 Office Visit    Patient Name: Brendan Summers Date of Encounter: 06/01/2024  Primary Care Provider:  Nena Rosina CROME, PA-C Primary Cardiologist:  None  Chief Complaint    Hyperlipidemia   Significant Past Medical History   CAD 6/24 CAC = 171 (94th percentile)  HTN Elevated at last visit, on amlodipine   hypothyroid 12/24 TSH WNL on NP thyroid 90  hypogonadism On weekly injections     No Known Allergies  History of Present Illness    Brendan Summers is a 53 y.o. male patient of Dr Kate, in the office today to discuss options for cholesterol management.  He recently had labs drawn at his PCP office, showing LDL has increased back to 836 since stopping the statin.    Insurance Carrier:  Control and instrumentation engineer:  [employer],      LDL Cholesterol goal:  LDL < 70  Current Medications:  none   Previously tried:  atorvastatin , rosuvastatin  - myalgias  Family Hx:   both parents on atorvastatin  and doing well; paternal grandfather died from MI, as did paternal uncle; 1 sister with DM;  kids 76, 25  Social Hx: Tobacco: no Alcohol: no     Exercise: works as Company secretary, does treadmill on off days  Accessory Clinical Findings   04/13/24  by PCP:  TC 252, TG 103, HDL 71, LDL 163  Lab Results  Component Value Date   CHOL 144 01/16/2024   HDL 56 01/16/2024   LDLCALC 73 01/16/2024   TRIG 79 01/16/2024   CHOLHDL 2.6 01/16/2024    No results found for: LIPOA  No results found for: ALT, AST, GGT, ALKPHOS, BILITOT Lab Results  Component Value Date   CREATININE 1.00 06/21/2012   BUN 25 (H) 06/21/2012   NA 139 06/21/2012   K 3.5 06/21/2012   CL 100 06/21/2012   CO2 25 06/21/2012   No results found for: HGBA1C  Home Medications    Current Outpatient Medications  Medication Sig Dispense Refill   ALPRAZolam (XANAX) 0.25 MG tablet Take 1 mg by mouth as needed.     amLODipine  (NORVASC ) 10 MG tablet Take 1 tablet (10 mg total) by mouth daily. 90  tablet 3   ANDROGEL PUMP 20.25 MG/ACT (1.62%) GEL daily. Use 2 pumps on each shoulder daily     Arginine (L-ARGININE-500) 500 MG CAPS Take by mouth as needed. 5 grams as needed     aspirin EC 81 MG tablet Take 81 mg by mouth daily. Swallow whole.     B-D 3CC LUER-LOK SYR 18GX1-1/2 18G X 1-1/2 3 ML MISC USE TO DRAW UP TESTOSTERONE     Cholecalciferol (D3 EXTRA STRENGTH) 125 MCG (5000 UT) CAPS      citalopram (CELEXA) 10 MG tablet Take 10 mg by mouth daily. (Patient not taking: Reported on 01/22/2024)     escitalopram (LEXAPRO) 10 MG tablet Take 10 mg by mouth daily.     escitalopram (LEXAPRO) 20 MG tablet Take 20 mg by mouth daily.     Homeopathic Products (LIVER SUPPORT SL)      levocetirizine (XYZAL ALLERGY 24HR) 5 MG tablet Take 5 mg by mouth every evening.     NP THYROID 90 MG tablet Take 90 mg by mouth daily.     Omega-3 1000 MG CAPS      RABEprazole (ACIPHEX) 20 MG tablet Take 20 mg by mouth daily.     Testosterone Cypionate 200 MG/ML KIT Inject 5 mLs into the skin. weekly  No current facility-administered medications for this visit.     Assessment & Plan    Hyperlipidemia LDL goal <70 Assessment: Patient with ASCVD not at LDL goal of < 70 Most recent LDL 163 on 04/13/24 Not able to tolerate statins secondary to myalgias - tried atorvastatin  and rosuvastatin  Reviewed options for lowering LDL cholesterol, including ezetimibe, PCSK-9 inhibitors, bempedoic acid and inclisiran.  Discussed mechanisms of action, dosing, side effects, potential decreases in LDL cholesterol and costs.  Also reviewed potential options for patient assistance.  Plan: Patient agreeable to starting Repatha 140 mg q14d Repeat labs after:  3 months Lipid Liver function   Allean Mink, PharmD CPP [facility] [address]   [place], [place] [address] [phone]  06/01/2024, 3:17 PM

## 2024-06-08 ENCOUNTER — Other Ambulatory Visit: Payer: Self-pay

## 2024-06-08 MED ORDER — AMLODIPINE BESYLATE 10 MG PO TABS: 10.0000 mg | ORAL_TABLET | Freq: Every day | ORAL | 2 refills | Status: DC

## 2024-06-14 ENCOUNTER — Encounter (HOSPITAL_BASED_OUTPATIENT_CLINIC_OR_DEPARTMENT_OTHER): Payer: Self-pay

## 2024-06-15 ENCOUNTER — Ambulatory Visit (HOSPITAL_BASED_OUTPATIENT_CLINIC_OR_DEPARTMENT_OTHER)

## 2024-06-15 ENCOUNTER — Ambulatory Visit: Payer: Self-pay | Admitting: Cardiology

## 2024-06-15 DIAGNOSIS — I77819 Aortic ectasia, unspecified site: Secondary | ICD-10-CM | POA: Diagnosis not present

## 2024-06-15 LAB — ECHOCARDIOGRAM COMPLETE
Area-P 1/2: 3.33 cm2
Est EF: 55
S' Lateral: 3.85 cm

## 2024-06-21 ENCOUNTER — Encounter: Payer: Self-pay | Admitting: Pharmacist Clinician (PhC)/ Clinical Pharmacy Specialist

## 2024-06-21 DIAGNOSIS — E785 Hyperlipidemia, unspecified: Secondary | ICD-10-CM

## 2024-06-22 ENCOUNTER — Other Ambulatory Visit (HOSPITAL_COMMUNITY): Payer: Self-pay

## 2024-06-22 ENCOUNTER — Telehealth: Payer: Self-pay

## 2024-06-22 MED ORDER — REPATHA SURECLICK 140 MG/ML ~~LOC~~ SOAJ: 140.0000 mg | SUBCUTANEOUS | 3 refills | Status: AC

## 2024-06-22 NOTE — Telephone Encounter (Signed)
 Pharmacy Patient Advocate Encounter   Received notification from Physician's Office that prior authorization for REPATHA  is required/requested.   Insurance verification completed.   The patient is insured through [facility] .   Per test claim: The current 28 day co-pay is, $35.  No PA needed at this time. This test claim was processed through [facility]- copay amounts may vary at other pharmacies due to pharmacy/plan contracts, or as the patient moves through the different stages of their insurance plan.

## 2024-09-24 LAB — LIPID PANEL
Chol/HDL Ratio: 2.5 ratio (ref 0.0–5.0)
Cholesterol, Total: 151 mg/dL (ref 100–199)
HDL: 61 mg/dL (ref >39)
LDL Chol Calc (NIH): 68 mg/dL (ref 0–99)
Triglycerides: 124 mg/dL (ref 0–149)
VLDL Cholesterol Cal: 22 mg/dL (ref 5–40)

## 2024-09-25 ENCOUNTER — Ambulatory Visit: Payer: Self-pay | Admitting: Pharmacist Clinician (PhC)/ Clinical Pharmacy Specialist

## 2024-12-16 ENCOUNTER — Telehealth: Payer: Self-pay | Admitting: Cardiology

## 2024-12-16 NOTE — Telephone Encounter (Signed)
 Pt asking if he should have labs prior to 4/21 appt with Dr. Kate. Please advise.

## 2024-12-17 ENCOUNTER — Other Ambulatory Visit: Payer: Self-pay

## 2024-12-17 DIAGNOSIS — E785 Hyperlipidemia, unspecified: Secondary | ICD-10-CM

## 2024-12-17 DIAGNOSIS — Z79899 Other long term (current) drug therapy: Secondary | ICD-10-CM

## 2024-12-17 NOTE — Telephone Encounter (Signed)
Recommend lipid panel prior to appointment

## 2024-12-17 NOTE — Telephone Encounter (Signed)
 Spoke with pt. Recommendation given. Stated understanding

## 2025-01-02 ENCOUNTER — Other Ambulatory Visit: Payer: Self-pay | Admitting: Cardiology

## 2025-03-22 ENCOUNTER — Ambulatory Visit: Admitting: Cardiology
